# Patient Record
Sex: Female | Born: 1995 | Race: White | Hispanic: No | Marital: Single
Health system: Southern US, Community
[De-identification: ages and names within clinical notes are randomized; demographics above are authoritative.]

---

## 2019-04-10 ENCOUNTER — Emergency Department (HOSPITAL_COMMUNITY)
Admission: EM | Admit: 2019-04-10 | Discharge: 2019-04-11 | Disposition: A | Discharge status: Home / Self Care | Payer: BC Managed Care – PPO | Attending: Emergency Medicine | Admitting: Emergency Medicine

## 2019-04-10 ENCOUNTER — Other Ambulatory Visit: Payer: Self-pay

## 2019-04-10 ENCOUNTER — Encounter (HOSPITAL_COMMUNITY): Payer: Self-pay

## 2019-04-10 ENCOUNTER — Emergency Department (HOSPITAL_COMMUNITY): Payer: BC Managed Care – PPO

## 2019-04-10 DIAGNOSIS — U071 COVID-19: Secondary | ICD-10-CM | POA: Diagnosis not present

## 2019-04-10 DIAGNOSIS — R21 Rash and other nonspecific skin eruption: Secondary | ICD-10-CM | POA: Diagnosis present

## 2019-04-10 DIAGNOSIS — B09 Unspecified viral infection characterized by skin and mucous membrane lesions: Secondary | ICD-10-CM | POA: Diagnosis not present

## 2019-04-10 LAB — LACTIC ACID, PLASMA
Lactic Acid, Venous: 2.7 mmol/L (ref 0.5–1.9)
Lactic Acid, Venous: 2.4 mmol/L (ref 0.5–1.9)

## 2019-04-10 LAB — I-STAT BETA HCG BLOOD, ED (MC, WL, AP ONLY): I-stat hCG, quantitative: <5 m[IU]/mL (ref <5)

## 2019-04-10 LAB — CBC WITH DIFFERENTIAL/PLATELET
Abs Immature Granulocytes: 0.06 10*3/uL (ref 0.00–0.07)
Basophils Absolute: 0 10*3/uL (ref 0.0–0.1)
Basophils Relative: 0 %
Eosinophils Absolute: 0 10*3/uL (ref 0.0–0.5)
Eosinophils Relative: 0 %
HCT: 49.1 % — ABNORMAL HIGH (ref 36.0–46.0)
Hemoglobin: 16.6 g/dL — ABNORMAL HIGH (ref 12.0–15.0)
Immature Granulocytes: 0 %
Lymphocytes Relative: 15 %
Lymphs Abs: 2 10*3/uL (ref 0.7–4.0)
MCH: 29 pg (ref 26.0–34.0)
MCHC: 33.8 g/dL (ref 30.0–36.0)
MCV: 85.7 fL (ref 80.0–100.0)
Monocytes Absolute: 0.3 10*3/uL (ref 0.1–1.0)
Monocytes Relative: 2 %
Neutro Abs: 11.1 10*3/uL — ABNORMAL HIGH (ref 1.7–7.7)
Neutrophils Relative %: 83 %
Platelets: 323 10*3/uL (ref 150–400)
RBC: 5.73 MIL/uL — ABNORMAL HIGH (ref 3.87–5.11)
RDW: 11.4 % — ABNORMAL LOW (ref 11.5–15.5)
WBC: 13.5 10*3/uL — ABNORMAL HIGH (ref 4.0–10.5)
nRBC: 0 % (ref 0.0–0.2)

## 2019-04-10 LAB — COMPREHENSIVE METABOLIC PANEL
ALT: 20 U/L (ref 0–44)
AST: 16 U/L (ref 15–41)
Albumin: 3.5 g/dL (ref 3.5–5.0)
Alkaline Phosphatase: 53 U/L (ref 38–126)
Anion gap: 12 (ref 5–15)
BUN: 10 mg/dL (ref 6–20)
CO2: 26 mmol/L (ref 22–32)
Calcium: 9 mg/dL (ref 8.9–10.3)
Chloride: 101 mmol/L (ref 98–111)
Creatinine, Ser: 0.95 mg/dL (ref 0.44–1.00)
GFR calc Af Amer: >60 mL/min (ref >60)
GFR calc non Af Amer: >60 mL/min (ref >60)
Glucose, Bld: 107 mg/dL — ABNORMAL HIGH (ref 70–99)
Potassium: 3.6 mmol/L (ref 3.5–5.1)
Sodium: 139 mmol/L (ref 135–145)
Total Bilirubin: 0.9 mg/dL (ref 0.3–1.2)
Total Protein: 7 g/dL (ref 6.5–8.1)

## 2019-04-10 LAB — URINALYSIS, ROUTINE W REFLEX MICROSCOPIC
Bilirubin Urine: NEGATIVE
Glucose, UA: NEGATIVE mg/dL
Hgb urine dipstick: NEGATIVE
Ketones, ur: NEGATIVE mg/dL
Leukocytes,Ua: NEGATIVE
Nitrite: NEGATIVE
Protein, ur: 30 mg/dL — AB
Specific Gravity, Urine: 1.026 (ref 1.005–1.030)
pH: 5 (ref 5.0–8.0)

## 2019-04-10 LAB — TROPONIN I (HIGH SENSITIVITY)
Troponin I (High Sensitivity): 2 ng/L (ref <18)
Troponin I (High Sensitivity): 2 ng/L (ref <18)

## 2019-04-10 LAB — PROTIME-INR
INR: 1 (ref 0.8–1.2)
Prothrombin Time: 13.3 seconds (ref 11.4–15.2)

## 2019-04-10 MED ORDER — METHYLPREDNISOLONE SODIUM SUCC 125 MG IJ SOLR
125.0000 mg | Freq: Once | INTRAMUSCULAR | Status: AC
  Administered 2019-04-10: 20:00:00 125 mg via INTRAVENOUS
  Filled 2019-04-10: qty 2

## 2019-04-10 MED ORDER — SODIUM CHLORIDE 0.9 % IV BOLUS
1000.0000 mL | Freq: Once | INTRAVENOUS | Status: AC
  Administered 2019-04-10: 1000 mL via INTRAVENOUS

## 2019-04-10 MED ORDER — DIPHENHYDRAMINE HCL 50 MG/ML IJ SOLN
25.0000 mg | Freq: Once | INTRAMUSCULAR | Status: AC
  Administered 2019-04-10: 25 mg via INTRAVENOUS
  Filled 2019-04-10: qty 1

## 2019-04-10 MED ORDER — FAMOTIDINE IN NACL 20-0.9 MG/50ML-% IV SOLN
20.0000 mg | Freq: Once | INTRAVENOUS | Status: AC
  Administered 2019-04-10: 20 mg via INTRAVENOUS
  Filled 2019-04-10: qty 50

## 2019-04-10 MED ORDER — HYDROXYZINE HCL 25 MG PO TABS
50.0000 mg | ORAL_TABLET | Freq: Once | ORAL | Status: AC
  Administered 2019-04-10: 50 mg via ORAL
  Filled 2019-04-10: qty 2

## 2019-04-10 NOTE — ED Triage Notes (Signed)
Pt tested positive for COVID on Wednesday, pt now having full body hives/rash that she noticed yesterday and c.o swelling in her knees, feet, lips and neck. Denies any difficulty breathing or swallowing. Airway intact. Tachycardic in triage 130s

## 2019-04-10 NOTE — ED Notes (Signed)
Pt stated rash and itching is reducing since meds

## 2019-04-10 NOTE — ED Provider Notes (Addendum)
[facility] EMERGENCY DEPARTMENT Provider Note   CSN: [id number] Arrival date & time: 04/10/19  1727     History Chief Complaint  Patient presents with  . COVID+  . Rash    Dawn Fox is a 24 y.o. female.  HPI Patient presents to the emergency department with diffuse rash and a Covid positive this past Wednesday.  The patient states that she started with a rash 2 days ago.  She states she had nausea vomiting diarrhea abdominal discomfort with rash to the trunk and swelling and rash of her hands and feet.  Patient states that she is taken medications over-the-counter without relief of her symptoms.  Patient states that she has had some generalized body aches as well.  The patient states that nothing seems to make her condition better.  She states that she is having difficulty walking due to the swelling and pain of her feet.  The patient denies chest pain, shortness of breath, headache,blurred vision, neck pain, fever, cough, weakness, numbness, dizziness, anorexia, edema, abdominal pain, nausea, vomiting, diarrhea, rash, back pain, dysuria, hematemesis, bloody stool, near syncope, or syncope.  History reviewed. No pertinent past medical history.  There are no problems to display for this patient.   History reviewed. No pertinent surgical history.   OB History   No obstetric history on file.     No family history on file.  Social History   Tobacco Use  . Smoking status: Not on file  Substance Use Topics  . Alcohol use: Not on file  . Drug use: Not on file    Home Medications Prior to Admission medications   Not on File    Allergies    Penicillins  Review of Systems   Review of Systems All other systems negative except as documented in the HPI. All pertinent positives and negatives as reviewed in the HPI. Physical Exam Updated Vital Signs BP 101/76   Pulse 91   Temp 98.1 F (36.7 C) (Oral)   Resp (!) 23   Ht 5\' 4"  (1.626 m)   Wt 79.4  kg   LMP 04/03/2019   SpO2 97%   BMI 30.04 kg/m   Physical Exam Vitals and nursing note reviewed.  Constitutional:      General: She is not in acute distress.    Appearance: She is well-developed.  HENT:     Head: Normocephalic and atraumatic.  Eyes:     Pupils: Pupils are equal, round, and reactive to light.  Cardiovascular:     Rate and Rhythm: Regular rhythm. Tachycardia present.     Heart sounds: Normal heart sounds. No murmur. No friction rub. No gallop.   Pulmonary:     Effort: Pulmonary effort is normal. No respiratory distress.     Breath sounds: Normal breath sounds. No wheezing.  Abdominal:     General: Bowel sounds are normal. There is no distension.     Palpations: Abdomen is soft.     Tenderness: There is no abdominal tenderness.  Musculoskeletal:     Cervical back: Normal range of motion and neck supple.  Skin:    General: Skin is warm and dry.     Capillary Refill: Capillary refill takes less than 2 seconds.     Findings: Rash present. No erythema.  Neurological:     Mental Status: She is alert and oriented to person, place, and time.     Motor: No abnormal muscle tone.     Coordination: Coordination normal.  Psychiatric:        Behavior: Behavior normal.     ED Results / Procedures / Treatments   Labs (all labs ordered are listed, but only abnormal results are displayed) Labs Reviewed  COMPREHENSIVE METABOLIC PANEL - Abnormal; Notable for the following components:      Result Value   Glucose, Bld 107 (*)    All other components within normal limits  LACTIC ACID, PLASMA - Abnormal; Notable for the following components:   Lactic Acid, Venous 2.7 (*)    All other components within normal limits  LACTIC ACID, PLASMA - Abnormal; Notable for the following components:   Lactic Acid, Venous 2.4 (*)    All other components within normal limits  CBC WITH DIFFERENTIAL/PLATELET - Abnormal; Notable for the following components:   WBC 13.5 (*)    RBC 5.73 (*)     Hemoglobin 16.6 (*)    HCT 49.1 (*)    RDW 11.4 (*)    Neutro Abs 11.1 (*)    All other components within normal limits  URINALYSIS, ROUTINE W REFLEX MICROSCOPIC - Abnormal; Notable for the following components:   APPearance HAZY (*)    Protein, ur 30 (*)    Bacteria, UA FEW (*)    All other components within normal limits  CULTURE, BLOOD (ROUTINE X 2)  CULTURE, BLOOD (ROUTINE X 2)  PROTIME-INR  FERRITIN  C-REACTIVE PROTEIN  SEDIMENTATION RATE  I-STAT BETA HCG BLOOD, ED ([facility], [facility], [facility] ONLY)  TROPONIN I (HIGH SENSITIVITY)  TROPONIN I (HIGH SENSITIVITY)    EKG None  Radiology DG Chest Portable 1 View  Result Date: 04/10/2019 CLINICAL DATA:  COVID pneumonia. EXAM: PORTABLE CHEST 1 VIEW COMPARISON:  None. FINDINGS: The heart size and mediastinal contours are within normal limits. Both lungs are clear. The visualized skeletal structures are unremarkable. IMPRESSION: No active disease. Electronically Signed   By: Katherine Mantle M.D.   On: 04/10/2019 18:32    Procedures Procedures (including critical care time)  Medications Ordered in ED Medications  sodium chloride 0.9 % bolus 1,000 mL (0 mLs Intravenous Stopped 04/10/19 2037)  diphenhydrAMINE (BENADRYL) injection 25 mg (25 mg Intravenous Given 04/10/19 1955)  famotidine (PEPCID) IVPB 20 mg premix (0 mg Intravenous Stopped 04/10/19 2036)  methylPREDNISolone sodium succinate (SOLU-MEDROL) 125 mg/2 mL injection 125 mg (125 mg Intravenous Given 04/10/19 1954)  hydrOXYzine (ATARAX/VISTARIL) tablet 50 mg (50 mg Oral Given 04/10/19 2158)    ED Course  I have reviewed the triage vital signs and the nursing notes.  Pertinent labs & imaging results that were available during my care of the patient were reviewed by me and considered in my medical decision making (see chart for details).    MDM Rules/Calculators/A&P                      There is concern for rectal systemic inflammatory response with a Covid due to the fact she has  swelling with rash of the feet and hands also diffuse rash of the body and trunk with the preceding nausea vomiting abdominal pain.  The patient is very uncomfortable in the room.  She also complaining of continued itching she initially was tachycardic and she does appear dehydrated based on her laboratory testing.  Will draw inflammatory markers on the patient. I spoke with the dermatologist over at [facility] and they have seen this and some Covid patients typical viral type exanthem rash.  On reexamination of the patient she is markedly improved with  the swelling redness rash.  There is still some redness and rash and discomfort in the feet but not as dramatic as earlier.  I feel that the patient will be able to be discharged home.  She was given hydroxyzine which is relieved her itching at this point.  After the lengthy discussion and further review and thorough review of her charting and laboratory testing with the specialist over at [facility] I feel that we can have her discharged home and return for any worsening in her condition.  The patient's respiratory status has not been at all with issue and she has not been tachycardic other than initially when she came in and she was dehydrated.  She received a liter of fluids.  And improved dramatically with this as well.  The patient is advised she will need to return here for any worsening in her condition. Final Clinical Impression(s) / ED Diagnoses Final diagnoses:  [id number] virus infection  Rash    Rx / DC Orders ED Discharge Orders    None       Dalia Heading, PA-C 04/10/19 2300    Dalia Heading, PA-C 04/11/19 0013    Valarie Merino, MD 04/17/19 [phone]

## 2019-04-10 NOTE — ED Notes (Signed)
Peripheral swelling continues. Pt states improvement of itchiness with medications.

## 2019-04-10 NOTE — ED Notes (Signed)
Called [facility] for PA [employer]

## 2019-04-10 NOTE — ED Notes (Signed)
Pt attempting to provide a urine sample.

## 2019-04-10 NOTE — ED Notes (Signed)
Pt c/o continued itchiness from rash on generalized body. Requesting something to help relieve itchiness. PA Lawyer informed. Orders for hydroxyzine given.

## 2019-04-11 LAB — FERRITIN: Ferritin: 62 ng/mL (ref 11–307)

## 2019-04-11 LAB — C-REACTIVE PROTEIN: CRP: 4.6 mg/dL — ABNORMAL HIGH (ref <1.0)

## 2019-04-11 LAB — SEDIMENTATION RATE: Sed Rate: 13 mm/hr (ref 0–22)

## 2019-04-11 MED ORDER — FAMOTIDINE 20 MG PO TABS: 20.0000 mg | ORAL_TABLET | Freq: Two times a day (BID) | ORAL | 0 refills | Status: AC

## 2019-04-11 MED ORDER — HYDROXYZINE HCL 25 MG PO TABS: 25.0000 mg | ORAL_TABLET | Freq: Four times a day (QID) | ORAL | 0 refills | Status: AC

## 2019-04-11 MED ORDER — PREDNISONE 50 MG PO TABS: 50.0000 mg | ORAL_TABLET | Freq: Every day | ORAL | 0 refills | Status: AC

## 2019-04-11 NOTE — Discharge Instructions (Addendum)
Return here for any worsening in your condition.  Increase your fluid intake and rest as much as possible.

## 2019-04-11 NOTE — ED Notes (Signed)
Discharge instructions discussed with pt. Pt verbalized understanding with no questions at this time. Pt to go home with mother.  

## 2019-04-11 NOTE — ED Provider Notes (Signed)
Here for rash - viral - 8 days Also COVID + Derm fellows and hospitalist consulted Given: Solumedrol, pepcid, benadryl, hydroxyzine with significant improvement Pending discharge when ride is found  Mother is coming to pick her up.  Re-eval prior to discharge: she continues to feel much better. She is comfortable going home per plan of previous provider team.    Elpidio Anis, PA-C 04/11/19 [id number]    Wynetta Fines, MD 04/17/19 [phone]

## 2019-04-15 LAB — CULTURE, BLOOD (ROUTINE X 2)
Culture: NO GROWTH
Culture: NO GROWTH
Special Requests: ADEQUATE

## 2020-08-30 IMAGING — DX DG CHEST 1V PORT
1 series · 1 of 1 positions shown · non-contrast
Comparison: None.

CLINICAL DATA: COVID pneumonia.

EXAM:
PORTABLE CHEST 1 VIEW

[chest ap]
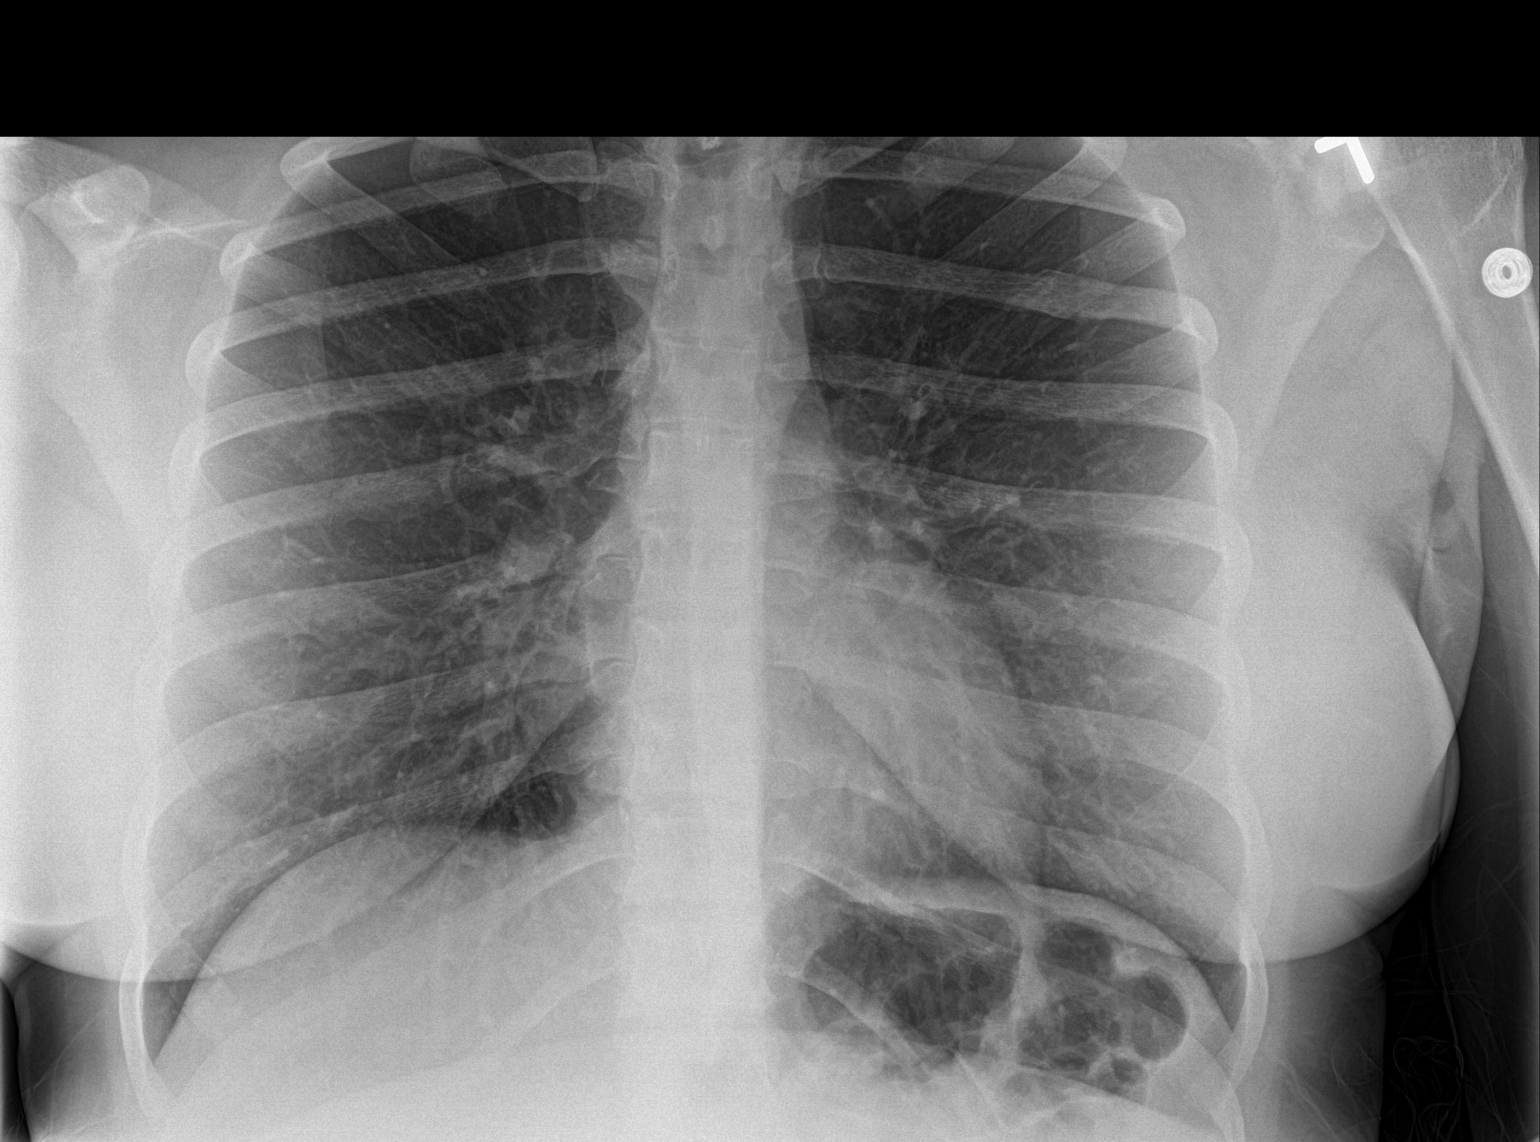

[1 of 1 positions shown; findings below may reference images not displayed]

FINDINGS: The heart size and mediastinal contours are within normal limits.
Both lungs are clear. The visualized skeletal structures are
unremarkable.
IMPRESSION: No active disease.
